# Patient Record
Sex: Female | Born: 1970 | Hispanic: Yes | Marital: Married | Smoking: Never smoker
Health system: Southern US, Community
[De-identification: ages and names within clinical notes are randomized; demographics above are authoritative.]

## PROBLEM LIST (undated history)

## (undated) DIAGNOSIS — Z9049 Acquired absence of other specified parts of digestive tract: Secondary | ICD-10-CM

## (undated) DIAGNOSIS — K449 Diaphragmatic hernia without obstruction or gangrene: Secondary | ICD-10-CM

## (undated) HISTORY — PX: BREAST SURGERY: SHX581

## (undated) HISTORY — PX: CHOLECYSTECTOMY: SHX55

## (undated) HISTORY — DX: Acquired absence of other specified parts of digestive tract: Z90.49

## (undated) HISTORY — DX: Diaphragmatic hernia without obstruction or gangrene: K44.9

## (undated) HISTORY — PX: HIATAL HERNIA REPAIR: SHX195

---

## 2021-11-06 ENCOUNTER — Other Ambulatory Visit: Payer: Self-pay

## 2021-11-06 DIAGNOSIS — Z1211 Encounter for screening for malignant neoplasm of colon: Secondary | ICD-10-CM

## 2021-11-07 ENCOUNTER — Ambulatory Visit: Payer: Self-pay | Attending: Oncology | Admitting: *Deleted

## 2021-11-07 ENCOUNTER — Ambulatory Visit: Payer: Self-pay

## 2021-11-07 ENCOUNTER — Other Ambulatory Visit: Payer: Self-pay

## 2021-11-07 VITALS — BP 124/68 | HR 74 | Resp 18 | Wt 158.0 lb

## 2021-11-07 DIAGNOSIS — Z1239 Encounter for other screening for malignant neoplasm of breast: Secondary | ICD-10-CM

## 2021-11-07 DIAGNOSIS — N644 Mastodynia: Secondary | ICD-10-CM

## 2021-11-07 DIAGNOSIS — N6321 Unspecified lump in the left breast, upper outer quadrant: Secondary | ICD-10-CM

## 2021-11-07 NOTE — Progress Notes (Signed)
[name] is a 51 y.o. female who presents to [facility] clinic today with complaint of left breast pain x one year that comes and goes. Patient rates the pain at a 6-7 out of 10. Patient referred to [facility] due to having a screening mammogram completed 09/20/2021 that additional imaging of the left breast recommended for follow up. Left breast diagnostic mammogram completed 10/13/2021 that a left breast biopsy is recommended for follow up.   Patient will go to [facility] following [facility] appointment to sign release to have images sent to [facility].   Pap Smear: Pap smear not completed today. Last Pap smear was 08/10/2021 at [facility] clinic and was normal with negative HPV. Per patient has no history of an abnormal Pap smear. Last Pap smear result is not available in Epic. Last Pap smear result will be scanned into Epic.   Physical exam: Breasts Breasts symmetrical. No skin abnormalities bilateral breasts. No nipple right breast. Left nipple inverted that per patient is normal for her. No nipple discharge bilateral breasts. No lymphadenopathy. No lumps palpated right breast. Palpated a pea sized lump within the left breast at 2 o'clock 6 cm from the nipple consistent with findings on mammogram. Complaints of left breast pain at 2 o'clock on exam.  Pelvic/Bimanual Pap is not indicated today per [facility] guidelines.   Smoking History: Patient has never smoked.   Patient Navigation: Patient education provided. Access to services provided for patient through [facility] program. Spanish interpreter Rito Ehrlich from [facility] provided.   Colorectal Cancer Screening: Per patient has never had colonoscopy completed. FIT Test given to patient to complete. No complaints today.    Breast and Cervical Cancer Risk Assessment: Patient does not have family history of breast cancer, known genetic mutations, or radiation treatment to the chest before age 1.  Patient does not have history of cervical dysplasia, immunocompromised, or DES exposure in-utero.  Risk Assessment     Risk Scores       11/07/2021   Last edited by: Lesle Chris, RN   5-year risk: 0.8 %   Lifetime risk: 6.8 %            A: [facility] exam without pap smear Complaints of left breast pain.  P: Referred patient to the [facility] for a left breast biopsy per recommendation. Delford Field will call patient to schedule appointment after they receive the outside mammogram images.  Priscille Heidelberg, RN 11/07/2021 1:30 PM

## 2021-11-07 NOTE — Patient Instructions (Signed)
Explained breast self awareness with Marin Olp.  Patient did not need a Pap smear today due to last Pap smear and HPV typing was 08/10/2021. Let her know [facility] will cover Pap smears and HPV typing every 5 years unless has a history of abnormal Pap smears. Referred patient to the [facility] for a left breast biopsy per recommendation. Delford Field will call patient to schedule appointment after they receive the outside mammogram images. Marin Olp verbalized understanding.  Elmarie Devlin, Kathaleen Maser, RN 1:30 PM

## 2021-11-15 ENCOUNTER — Ambulatory Visit: Payer: Self-pay

## 2021-11-15 ENCOUNTER — Telehealth: Payer: Self-pay

## 2021-11-15 NOTE — Telephone Encounter (Signed)
Via Julie Sowell, Spanish Interpreter ([place]), Patient informed negative FIT Test results. Patient verbalized understanding.  °

## 2021-11-16 ENCOUNTER — Inpatient Hospital Stay
Admission: RE | Admit: 2021-11-16 | Discharge: 2021-11-16 | Disposition: A | Discharge status: Home / Self Care | Payer: Self-pay | Source: Ambulatory Visit | Attending: *Deleted | Admitting: *Deleted

## 2021-11-16 ENCOUNTER — Other Ambulatory Visit: Payer: Self-pay | Admitting: *Deleted

## 2021-11-16 DIAGNOSIS — Z1231 Encounter for screening mammogram for malignant neoplasm of breast: Secondary | ICD-10-CM

## 2021-11-17 ENCOUNTER — Other Ambulatory Visit: Payer: Self-pay | Admitting: Obstetrics and Gynecology

## 2021-11-17 DIAGNOSIS — R928 Other abnormal and inconclusive findings on diagnostic imaging of breast: Secondary | ICD-10-CM

## 2021-11-29 ENCOUNTER — Other Ambulatory Visit: Payer: Self-pay | Admitting: Obstetrics and Gynecology

## 2021-11-29 DIAGNOSIS — R921 Mammographic calcification found on diagnostic imaging of breast: Secondary | ICD-10-CM

## 2021-11-29 DIAGNOSIS — R928 Other abnormal and inconclusive findings on diagnostic imaging of breast: Secondary | ICD-10-CM

## 2021-11-30 ENCOUNTER — Other Ambulatory Visit: Payer: Self-pay | Admitting: Obstetrics and Gynecology

## 2021-11-30 ENCOUNTER — Ambulatory Visit
Admission: RE | Admit: 2021-11-30 | Discharge: 2021-11-30 | Disposition: A | Discharge status: Home / Self Care | Payer: Self-pay | Source: Ambulatory Visit | Attending: Obstetrics and Gynecology | Admitting: Obstetrics and Gynecology

## 2021-11-30 ENCOUNTER — Other Ambulatory Visit: Payer: Self-pay

## 2021-11-30 ENCOUNTER — Inpatient Hospital Stay: Admission: RE | Admit: 2021-11-30 | Payer: Self-pay | Source: Ambulatory Visit

## 2021-11-30 DIAGNOSIS — R921 Mammographic calcification found on diagnostic imaging of breast: Secondary | ICD-10-CM | POA: Insufficient documentation

## 2021-11-30 DIAGNOSIS — R928 Other abnormal and inconclusive findings on diagnostic imaging of breast: Secondary | ICD-10-CM | POA: Insufficient documentation

## 2021-12-01 LAB — SURGICAL PATHOLOGY

## 2021-12-05 NOTE — Progress Notes (Signed)
Received benign biopsy result, and Radiology recommendation for surgical consult. Scheduled with Dr. Aleen Campi 12/27/21 at 10:15.  Phoned patient with appointment information, and mailed reminder/directions.  Instructed to call Joellyn Quails [facility] patient coordinator with questions . ?

## 2021-12-27 ENCOUNTER — Encounter: Payer: Self-pay | Admitting: Surgery

## 2021-12-27 ENCOUNTER — Ambulatory Visit (INDEPENDENT_AMBULATORY_CARE_PROVIDER_SITE_OTHER): Payer: Self-pay | Admitting: Surgery

## 2021-12-27 VITALS — BP 131/82 | HR 73 | Temp 97.7°F | Wt 157.0 lb

## 2021-12-27 DIAGNOSIS — N6082 Other benign mammary dysplasias of left breast: Secondary | ICD-10-CM

## 2021-12-27 NOTE — Patient Instructions (Signed)
If you have any concerns or questions, please feel free to call our office.  ? ?Autoexamen de mamas ?Breast Self-Awareness ?El autoexamen de mamas es para Geologist, engineering apariencia y la sensibilidad de las [place]. Es importante autoexaminarse las [place]. Permite detectar un problema en las mamas a tiempo mientras todav?a es peque?o y puede tratarse. Todas las mujeres deben autoexaminarse las [place], incluso aquellas que se sometieron a implantes mamarios. Informe al m?dico si advierte un cambio en las mamas. ?Lo que necesita: ?Un espejo. ?Una habitaci?n bien iluminada. ?C?mo realizar el autoexamen de [place] ?El autoexamen de [place] es una forma de aprender qu? es normal para sus mamas y revisar si hay cambios. Para hacer un autoexamen de las mamas: ?Busque cambios ? ?Qu?tese toda la ropa por encima de la cintura. ?P?rese frente a un espejo en una habitaci?n con buena iluminaci?n. ?Apoye las [employer] caderas. ?Empuje hacia abajo con las manos. ?M?rese las mamas y los pezones en el espejo para ver si una mama o un pez?n se ve diferente del otro. Durante el examen, intente determinar si: ?La forma de una mama es diferente. ?El tama?o de [place] mama es diferente. ?Hay arrugas, depresiones y protuberancias en [place] [employer] y no en la otra. ?Observe cada mama para buscar cambios en la piel, por ejemplo: ?Enrojecimiento. ?Zonas escamosas. ?Observe si hay cambios en los pezones, por ejemplo: ?L?quido alrededor [employer]. ?Sangrado. ?Hoyuelos. ?Enrojecimiento. ?Un cambio en el lugar de los pezones. ?[place] si hay cambios ? ?Acu?stese en el piso boca arriba. ?P?lpese cada mama. Para hacerlo, siga estos pasos: ?Elija una mama para palpar. ?Coloque el brazo m?s cercano a esa mama por encima de la cabeza. ?Use el otro brazo para palpar la zona del pez?n de la mama. P?lpese la zona con las yemas de los tres dedos del medio y haga c?rculos con los dedos. Con el primer c?rculo, presione suavemente. Con el segundo, m?s fuerte. Con el  tercero, a?n m?s fuerte. ?Siga haciendo c?rculos con los dedos con las diferentes presiones a medida que desciende por la mama. Det?ngase cuando sienta las costillas. ?Desplace los dedos un poco hacia el centro del cuerpo. ?Empiece a hacer c?rculos con los dedos nuevamente y esta vez haga movimientos ascendentes hasta llegar a la clav?cula. ?Siga haciendo c?rculos [place] y Milinda Antis llegar a la axila. Recuerde hacerlos con las tres presiones. ?P?lpese la otra mama de la misma forma. ?Si?ntese o p?rese en la ducha o la ba?era. ?Con agua jabonosa en la piel, p?lpese cada mama del mismo modo que lo hizo en el paso 2 mientras estaba acostada en el piso. ?Anote sus hallazgos ?Anotar lo que encuentra puede ayudarla a recordar qu? contarle al m?dico. Escriba los siguientes datos: ?Qu? es normal para cada mama. ?Cualquier cambio que encuentre en cada mama, por ejemplo: ?La clase de cambios que encuentra. ?Si tiene dolor. ?Si hay bultos, su tama?o y ubicaci?n. ?Cu?ndo tuvo su ?ltimo per?odo menstrual. ?Consejos generales ?Exam?nese las [employer]. ?Si est? amamantando, el mejor momento para el examen de las mamas es despu?s de darle de Psychologist, clinical al beb? o despu?s de Manufacturing engineer. ?Si tiene per?odos menstruales, el mejor momento para hacerlo es de 5 a 7 d?as despu?s de la finalizado el per?odo menstrual. ?Con el tiempo, se sentir? c?moda con el autoexamen y comenzar? a saber si hay cambios en sus mamas. ?Comun?quese con un m?dico si: ?Observa un cambio en la forma o el tama?o de las [place] o  los pezones. ?Observa un cambio en la piel de las mamas o los pezones, como la piel enrojecida o escamosa. ?Tiene secreci?n de l?quido proveniente de los pezones que no es normal. ?Encuentra un n?dulo o una zona engrosada que no ten?a antes. ?Tiene dolor en las mamas. ?Tiene alguna inquietud sobre la salud de la mama. ?Resumen ?El autoexamen de mamas incluye buscar cambios en las mamas, y tambi?n palpar para  detectar cualquier cambio en las mamas. ?El autoexamen de mamas debe hacerse frente a un espejo en una habitaci?n bien iluminada. ?Debe revisarse las mamas todos los meses. Si tiene per?odos menstruales, el mejor momento para hacerlo es de 5 a 7 d?as despu?s de la finalizado el per?odo menstrual. ?Informe al m?dico si ve cambios en las mamas, como cambios en el tama?o, cambios en la piel, dolor o sensibilidad, o un l?quido inusual que sale de los pezones. ?Esta informaci?n no tiene como fin reemplazar el consejo del m?dico. Aseg?rese de hacerle al m?dico cualquier pregunta que tenga. ?Document Revised: 06/10/2018 Document Reviewed: 06/10/2018 ?Elsevier Patient Education ? 2022 Elsevier Inc. ? ?

## 2021-12-27 NOTE — Progress Notes (Signed)
?12/27/2021 ? ?Reason for Visit:  Left breast flat epithelial atypia ? ?Requesting Provider:  Coralee Rud, RN ? ?History of Present Illness: ?Tracy Dawson is a 51 y.o. female presenting for evaluation of flat epithelial atypia.  The patient just recently moved from [place], [place].  Prior to her move, she had a screening mammogram which showed some calcifications of the left breast.  This was confirmed on diagnostic mammogram.  It was noted that she had an extensive area of calcification in the left outer lower quadrant, and two biopsies were recommended, one anterior and one posterior.  After her move, she was set up with our breast center and had the left breast biopsies done on 11/30/21.  Pathology results showed columnar cell lesion with associated calcifications and focal flat epithelial atypia.  Per radiology report, their recommendation is to excise the area of calcifications extending between both biopsy sites.   ? ?The patient reports that she was surprised about the initial mammography results and needing further imaging and then biopsies.  She reports having bilateral breast pain issues that she's had chronically, with sometimes the right breat being worse than the left breast.  She also has left nipple inversion and to a milder degree right nipple inversion, but this is also chronic for her.  She is post-menopausal. ? ?Past Medical History: ?Past Medical History:  ?Diagnosis Date  ? Hiatal hernia   ? History of cholecystectomy   ?  ? ?Past Surgical History: ?Past Surgical History:  ?Procedure Laterality Date  ? CHOLECYSTECTOMY    ? HIATAL HERNIA REPAIR    ? ? ?Home Medications: ?Prior to Admission medications   ?Not on File  ? ? ?Allergies: ?No Known Allergies ? ?Social History: ? reports that she has never smoked. She has never used smokeless tobacco. She reports that she does not drink alcohol and does not use drugs.  ? ?Family History: ?History reviewed. No pertinent family history. ? ?Review of  Systems: ?Review of Systems  ?Constitutional:  Negative for chills and fever.  ?HENT:  Negative for hearing loss.   ?Respiratory:  Negative for shortness of breath.   ?Cardiovascular:  Negative for chest pain.  ?Gastrointestinal:  Negative for abdominal pain, nausea and vomiting.  ?Genitourinary:  Negative for dysuria.  ?Musculoskeletal:  Negative for myalgias.  ?Skin:  Negative for rash.  ?     Bilateral breast pain  ?Neurological:  Negative for dizziness.  ?Psychiatric/Behavioral:  Negative for depression.   ? ?Physical Exam ?BP 131/82   Pulse 73   Temp 97.7 ?F (36.5 ?C) (Oral)   Wt 157 lb (71.2 kg)   SpO2 99%  ?CONSTITUTIONAL: No acute distress, well nourished. ?HEENT:  Normocephalic, atraumatic, extraocular motion intact. ?NECK: Trachea is midline, and there is no jugular venous distension.  ?RESPIRATORY:  Lungs are clear, and breath sounds are equal bilaterally. Normal respiratory effort without pathologic use of accessory muscles. ?CARDIOVASCULAR: Heart is regular without murmurs, gallops, or rubs. ?BREAST:  Right breast without any palpable masses, skin changes, or nipple changes other than her known nipple inversion.  She does have fibrocystic changes throughout which are tender in some areas.  No right axillary lymphadenopathy.  Left breast s/p lateral biopsies, with mild ecchymosis, but otherwise no palpable masses, skin changes, or nipple changes other than her known nipple inversion.  She also has fibrocystic changes similar to right breast, with some tenderness in different areas.  No left axillary lymphadenopathy. ?MUSCULOSKELETAL:  Normal muscle strength and tone in all four extremities.  No peripheral edema or cyanosis. ?SKIN: Skin turgor is normal. There are no pathologic skin lesions.  ?NEUROLOGIC:  Motor and sensation is grossly normal.  Cranial nerves are grossly intact. ?PSYCH:  Alert and oriented to person, place and time. Affect is normal. ? ?Laboratory Analysis: ?Pathology results  11/30/21: ?DIAGNOSIS:  ?A.  BREAST CALCIFICATIONS, LEFT LATERAL LOWER POSTERIOR; STEREOTACTIC  ?BIOPSY (X CLIP):  ?- COLUMNAR CELL LESION WITH ASSOCIATED CALCIFICATIONS AND FOCAL FLAT  ?EPITHELIAL ATYPIA.  ?- SEE COMMENT.  ? ?B.  BREAST CALCIFICATIONS, LEFT LATERAL LOWER ANTERIOR; STEREOTACTIC  ?BIOPSY (COIL CLIP):  ?- COLUMNAR CELL LESION WITH ASSOCIATED CALCIFICATIONS AND FOCAL FLAT  ?EPITHELIAL ATYPIA.  ?- SEE COMMENT.  ? ? ?Assessment and Plan: ?This is a 51 y.o. female with left breast flat epithelial atypia. ? ?--Discussed with the patient the findings on her mammograms and the biopsy results.  Unfortunately I do not have any outside reports from the initial mammograms, and it is unclear to me how large of an area this is.  When I measure the areas between the biopsy clips, this is about 4.5 x 2 cm when measuring the CC and ML views which does not include potential margins needed.  Overall this is a significant size when comparing to her breast size overall.  From the pathology results, and on search of flat epithelial atypia, there are different studies showing different results of whether is does or does not pose an increased risk of breast cancer.   ?--At this point, I discussed with the patient that I need to have a conversation with radiology to determine more accurate dimension of the area they recommend excising and if there is any role for close follow up of the calcifications instead of excision.  Based on the size and recommendations, I will call the patient back to discuss those and whether we should proceed with excision or close follow up.  If excision is recommended and the area involved is relatively large, she may benefit from referral to a breast surgeon that may be able to perform better cosmetic results or for a second opinion. ?--Patient is in agreement with this plan and all of her questions have been answered.  Follow up depends on conversation with radiology and decision from patient  afterwards. ? ?I spent 60 minutes dedicated to the care of this patient on the date of this encounter to include pre-visit review of records, face-to-face time with the patient discussing diagnosis and management, and any post-visit coordination of care. ? ? ?Howie Ill, MD ?[facility] ? ?  ?

## 2022-01-04 ENCOUNTER — Telehealth: Payer: Self-pay | Admitting: Surgery

## 2022-01-04 NOTE — Telephone Encounter (Signed)
01/04/22 ? ?Spoke with patient today about her mammogram images and diagnosis of FEA.  She was seen on 12/27/21 for this and the recommendation was that this area of FEA should be excised.  However, on mammograms, this area seemed to be large based on clip location from her two biopsies, and may be too large for her breast size to do a lumpectomy alone.  I contacted the radiology team at [facility] to confirm that size of the area, and this appears to be about 4.8 x 4 cm based on imaging.  Given the size, I think it would be better to refer the patient to a tertiary center to discuss with her about potential surgical options given the size of this area, and for possibly a better cosmetic result that what I would be able to offer.  She is in agreement and prefers a referral to [facility].  Patient may follow up with me as needed and she can contact us if any questions or concerns. ? ?Henrene Dodge, MD ? ?

## 2022-01-12 ENCOUNTER — Telehealth: Payer: Self-pay

## 2022-01-12 NOTE — Telephone Encounter (Signed)
Spoke with the patient via interpreter services and gave the patient the number for [facility]. They had attempted to contact the patient but her number was not working at that time. She will call them to schedule an appointment.  ?

## 2022-12-25 ENCOUNTER — Telehealth: Payer: Self-pay | Admitting: *Deleted

## 2022-12-25 ENCOUNTER — Other Ambulatory Visit: Payer: Self-pay

## 2022-12-25 DIAGNOSIS — N6320 Unspecified lump in the left breast, unspecified quadrant: Secondary | ICD-10-CM

## 2022-12-25 DIAGNOSIS — R921 Mammographic calcification found on diagnostic imaging of breast: Secondary | ICD-10-CM

## 2023-09-27 IMAGING — MG MM BREAST BX W LOC DEV 1ST LESION IMAGE BX SPEC STEREO GUIDE*L*
7 of 11 series · 7 of 31 positions shown · non-contrast
Comparison: Previous exams.
COMPARISON: Previous exams.

Addendum:
CLINICAL DATA: Left breast calcifications for biopsy

EXAM:
LEFT BREAST STEREOTACTIC CORE NEEDLE BIOPSY

[L (1 of 3)]
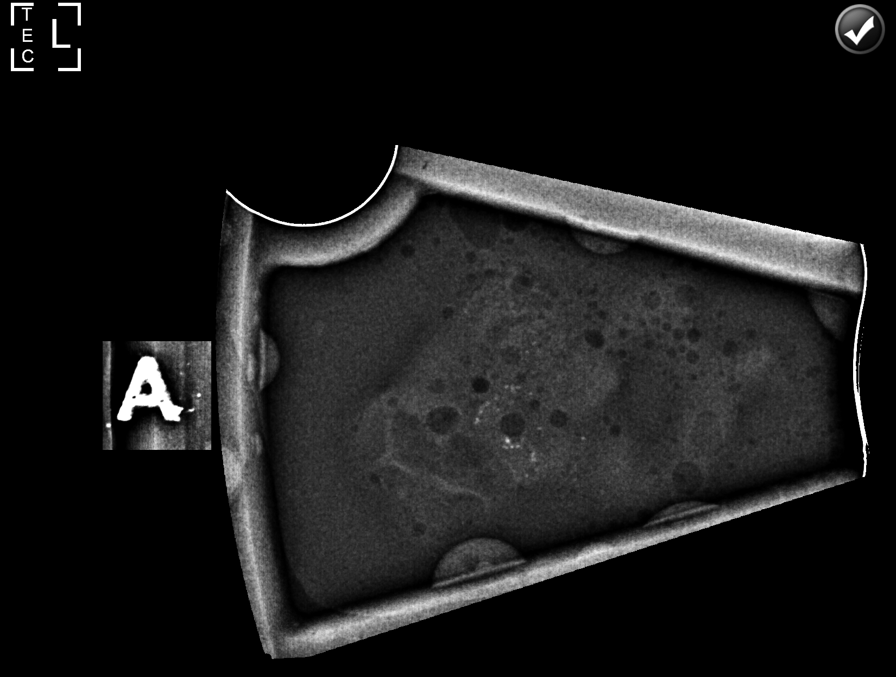

[L (2 of 3)]
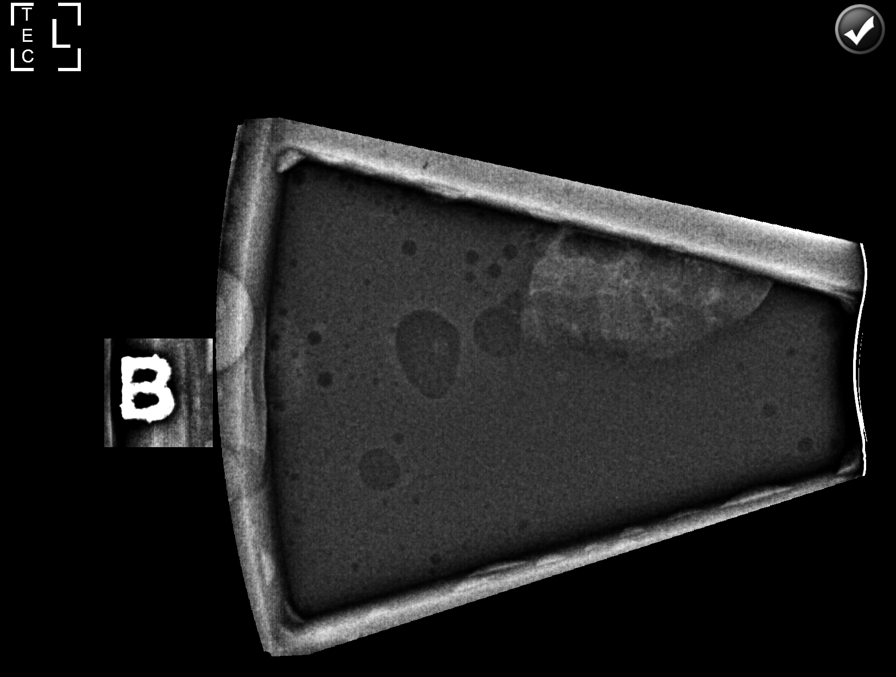

[L (3 of 3)]
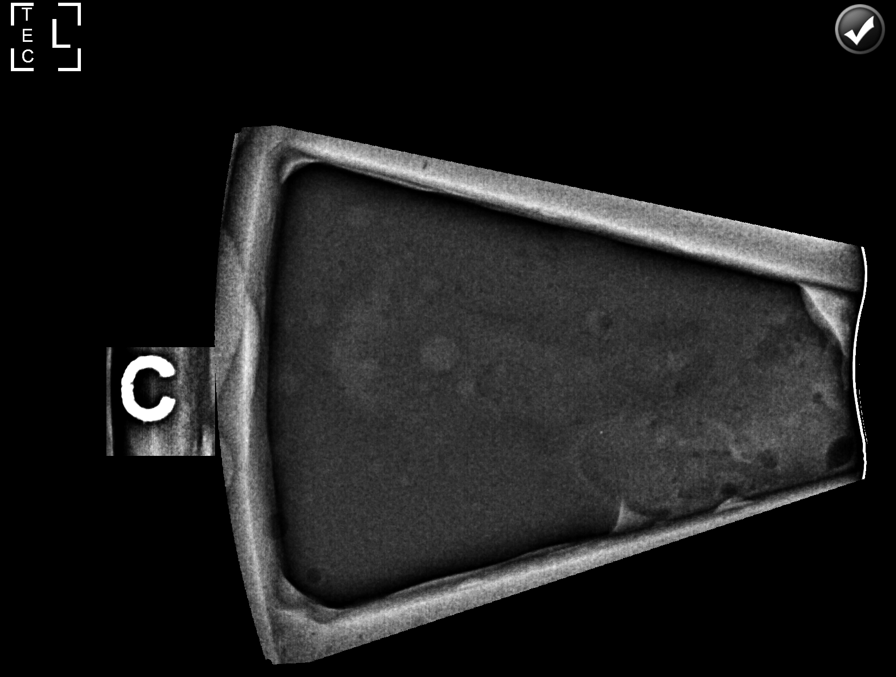

[L FB]
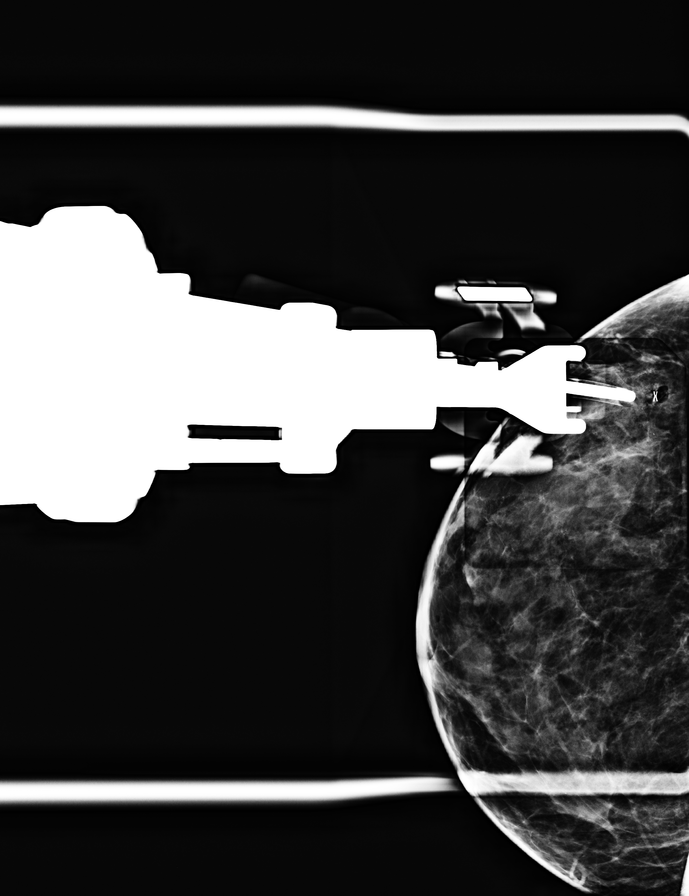

[L ML synth-2D]
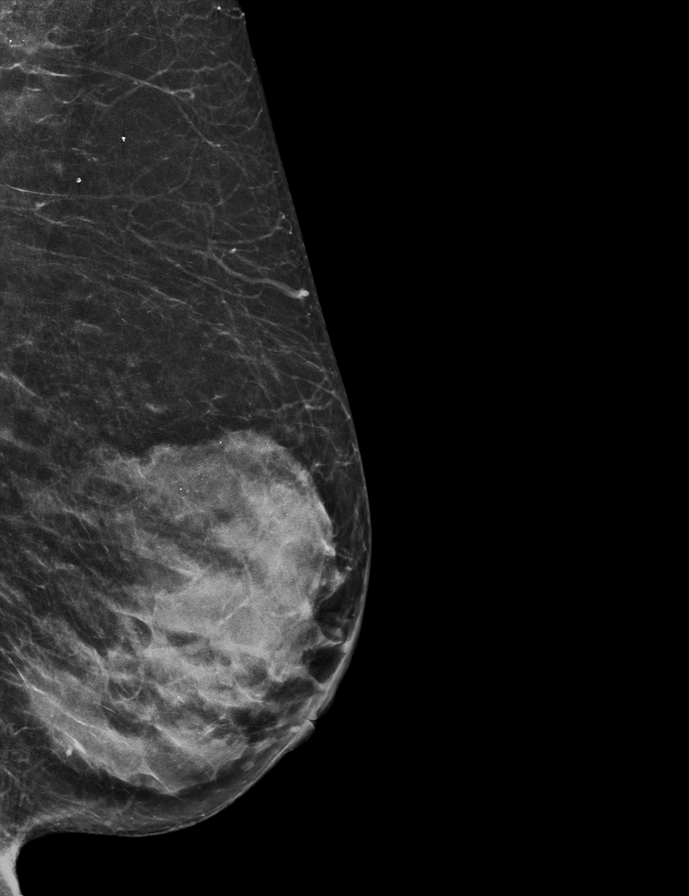

[L CC synth-2D]
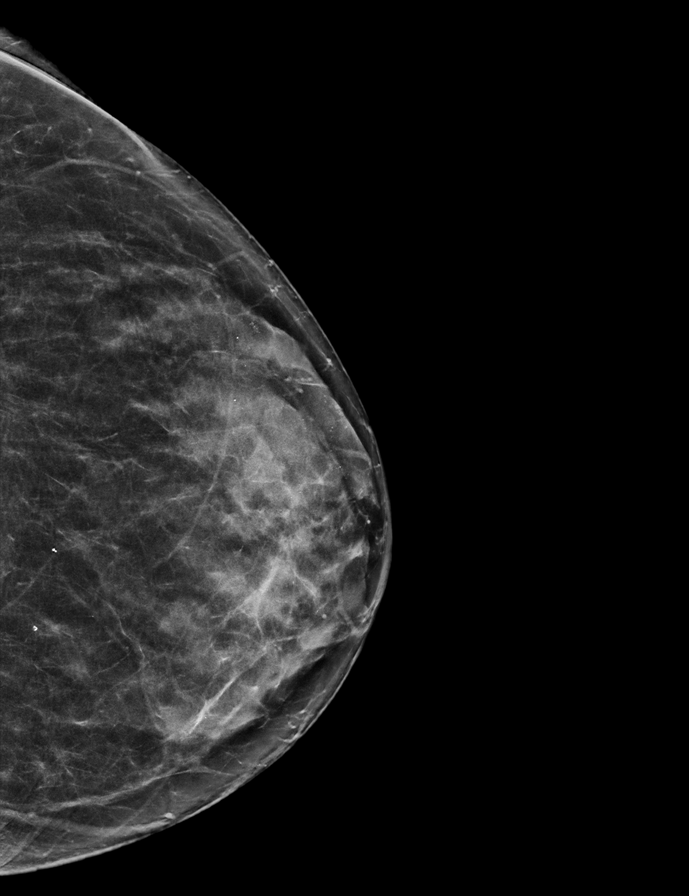

[L FB tomo · tomo slice 25/49.0]
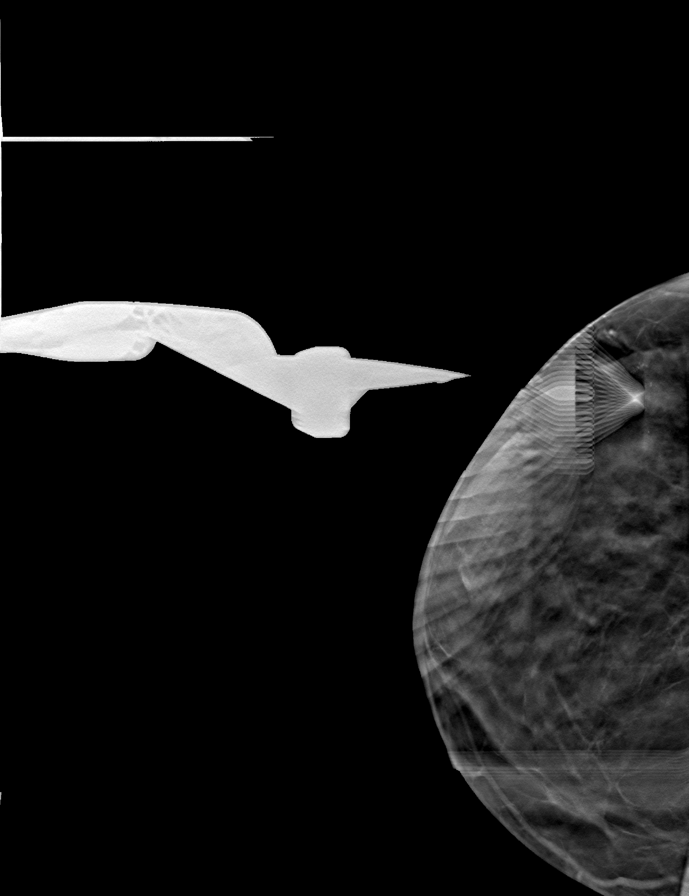

[7 of 31 positions shown; findings below may reference images not displayed]



A: Using sterile technique and 1% Lidocaine as local anesthetic,
under stereotactic guidance, a 9 gauge vacuum assisted device was
used to perform core needle biopsy of calcifications in the lateral
posterior lower left breast using a caudal approach. Specimen
radiograph was performed showing inclusion of calcifications of
concern. Specimens with calcifications are identified for pathology.

Lesion quadrant: Posterolateral lower quadrant

At the conclusion of the procedure, X shaped tissue marker clip was
deployed into the biopsy cavity. Follow-up 2-view mammogram was
performed and dictated separately.

B: Using sterile technique and 1% Lidocaine as local anesthetic,
under stereotactic guidance, a 9 gauge vacuum assisted device was
used to perform core needle biopsy of calcifications in the anterior
lower outer quadrant of the left breast using a caudal approach.
Specimen radiograph was performed showing inclusion of
calcifications concern. Specimens with calcifications are identified
for pathology.

Lesion quadrant: Anterolateral lower quadrant

At the conclusion of the procedure, coil shaped tissue marker clip
was deployed into the biopsy cavity. Follow-up 2-view mammogram was
performed and dictated separately.
IMPRESSION: Stereotactic-guided biopsy of left breast. No apparent
complications.

ADDENDUM:
PATHOLOGY revealed: Site A. BREAST CALCIFICATIONS, LEFT LATERAL
LOWER POSTERIOR; STEREOTACTIC BIOPSY (X CLIP): - COLUMNAR CELL
LESION WITH ASSOCIATED CALCIFICATIONS AND FOCAL FLAT EPITHELIAL
ATYPIA. - SEE COMMENT.

Pathology results are CONCORDANT with imaging findings, per Dr.
Alademehin Sidney with excision recommended.

PATHOLOGY revealed: Site B. BREAST CALCIFICATIONS, LEFT LATERAL
LOWER ANTERIOR; STEREOTACTIC BIOPSY (COIL CLIP): - COLUMNAR CELL
LESION WITH ASSOCIATED CALCIFICATIONS AND FOCAL FLAT EPITHELIAL
ATYPIA. - Comment: Sections demonstrate areas of dilated cystic
spaces lined by columnar cell change with focal low-grade cytologic
atypia. Invasive carcinoma, ductal carcinoma in situ (DCIS), and
atypical ductal hyperplasia (ADH) are not identified in this sampled
material. Columnar cell lesions (CCL) are most often detected by the
presence of grouped calcifications on mammography. The risk of
progression for flat epithelial atypia (BASIRA) appears to be very low
and is not associated with the same level of breast cancer risk
associated with atypical lobular hyperplasia (ALH) and atypical
ductal hyperplasia (ADH). As such, BASIRA should not be managed the
same as ALH and ADH. Correlation with post-biopsy mammogram is
advised to determine if the radiographic microcalcifications have
been removed and to guide further management.

Pathology results are CONCORDANT with imaging findings, per Dr.
Koffi Brenes with excision recommended.

Pathology results and recommendations below were discussed with
patient via [employer]# [id number] by telephone on 12/01/2021.
Patient reported biopsy site within normal limits with slight
tenderness at the site. Post biopsy care instructions were reviewed,
questions were answered and my direct phone number was provided to
patient. Patient was instructed to call [HOSPITAL] if
any concerns or questions arise related to the biopsy.

RECOMMENDATIONS: Surgical consultation for both sites A and B.
Recommend all calcifications between site A and B the excised.
Request for surgical consultation relayed to Na Dine Pohland RN and
Quo Ara RN at [HOSPITAL] [HOSPITAL] by Genny Viramontes
RN on 12/01/2021.

Pathology results reported by Genny Viramontes RN on 12/01/2021.



A: Using sterile technique and 1% Lidocaine as local anesthetic,
under stereotactic guidance, a 9 gauge vacuum assisted device was
used to perform core needle biopsy of calcifications in the lateral
posterior lower left breast using a caudal approach. Specimen
radiograph was performed showing inclusion of calcifications of
concern. Specimens with calcifications are identified for pathology.

Lesion quadrant: Posterolateral lower quadrant

At the conclusion of the procedure, X shaped tissue marker clip was
deployed into the biopsy cavity. Follow-up 2-view mammogram was
performed and dictated separately.

B: Using sterile technique and 1% Lidocaine as local anesthetic,
under stereotactic guidance, a 9 gauge vacuum assisted device was
used to perform core needle biopsy of calcifications in the anterior
lower outer quadrant of the left breast using a caudal approach.
Specimen radiograph was performed showing inclusion of
calcifications concern. Specimens with calcifications are identified
for pathology.

Lesion quadrant: Anterolateral lower quadrant

At the conclusion of the procedure, coil shaped tissue marker clip
was deployed into the biopsy cavity. Follow-up 2-view mammogram was
performed and dictated separately.
IMPRESSION: Stereotactic-guided biopsy of left breast. No apparent
complications.

## 2023-09-27 IMAGING — MG MM BREAST BX W LOC DEV EA AD LESION IMG BX SPEC STEREO GUIDE*L*
7 series · 7 of 19 positions shown · non-contrast
Comparison: Previous exams.
COMPARISON: Previous exams.

Addendum:
CLINICAL DATA: Left breast calcifications for biopsy

EXAM:
LEFT BREAST STEREOTACTIC CORE NEEDLE BIOPSY

[L (1 of 3)]
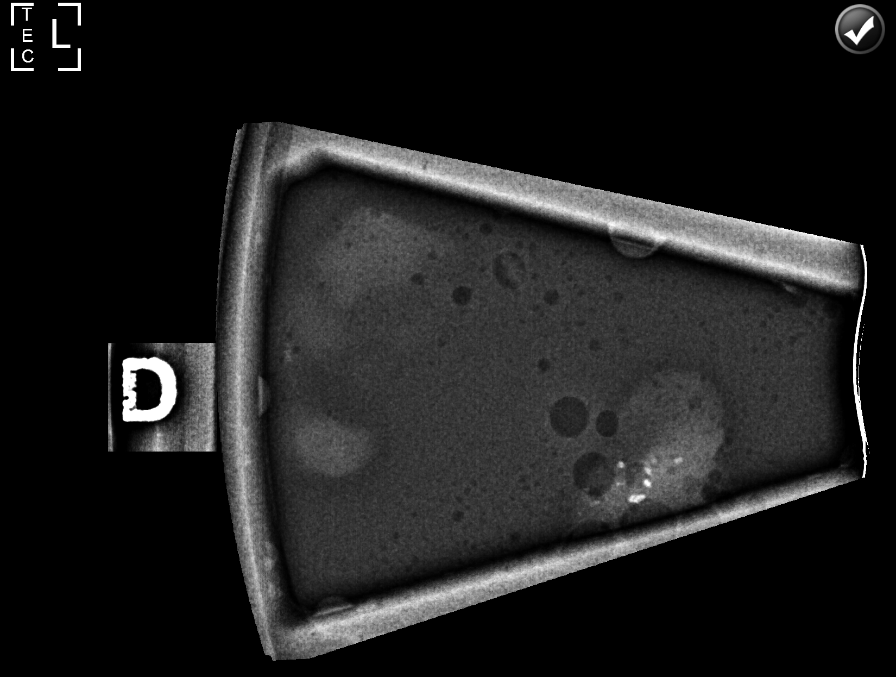

[L (2 of 3)]
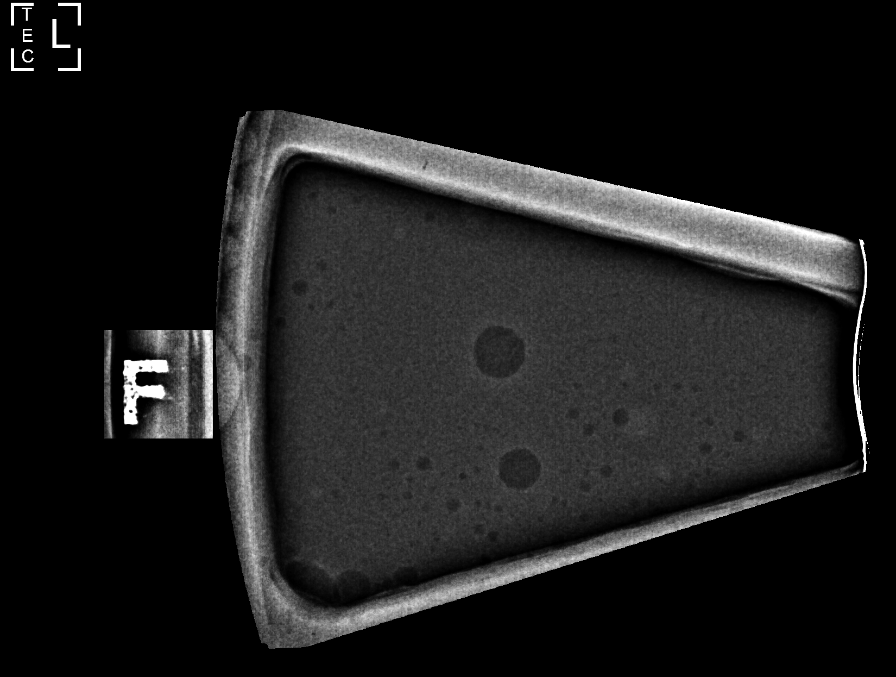

[L (3 of 3)]
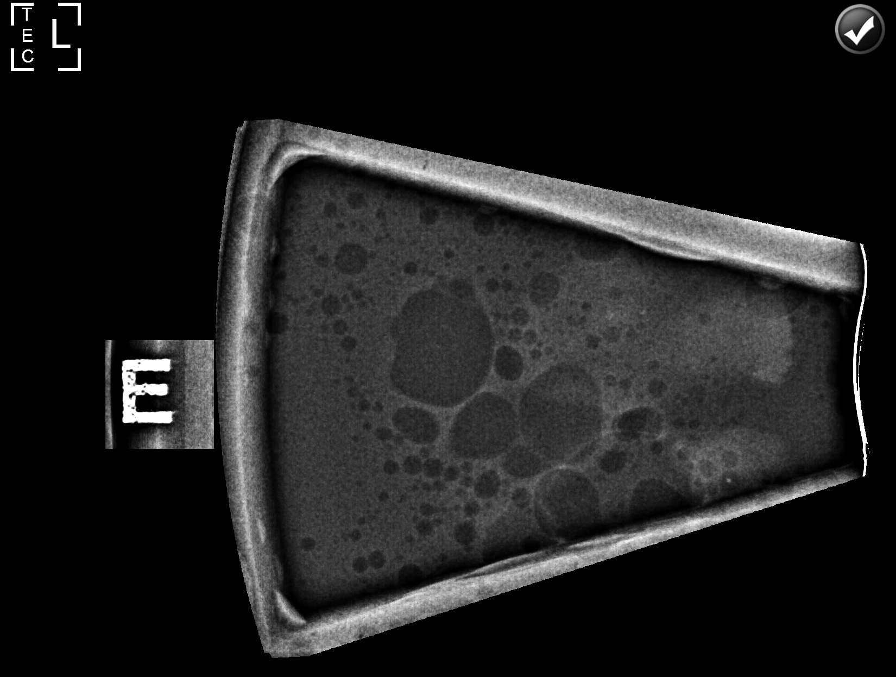

[L FB]
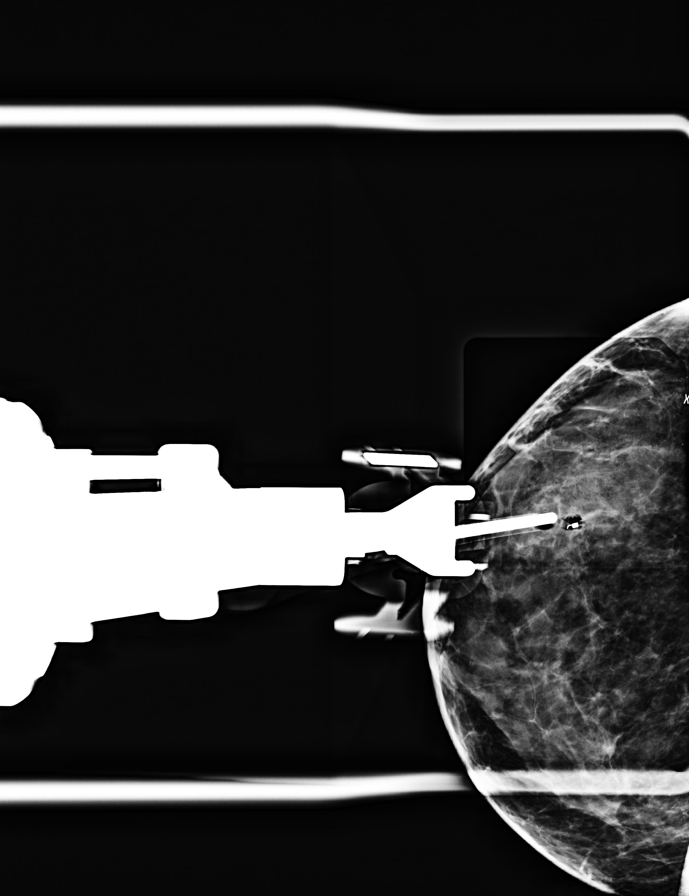

[L FB tomo (1 of 3) · tomo slice 25/49.0]
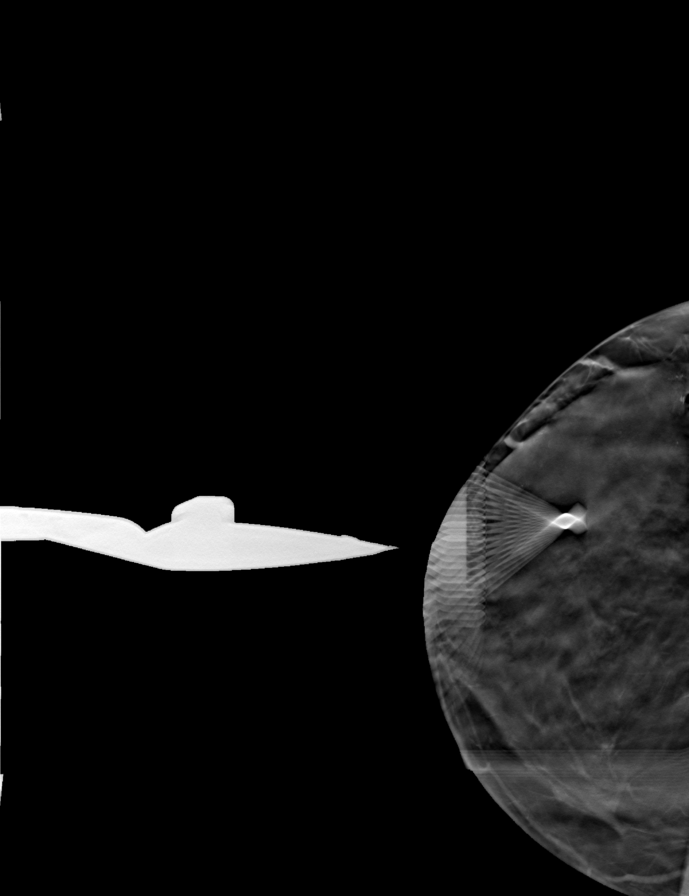

[L FB tomo (2 of 3) · tomo slice 25/49.0]
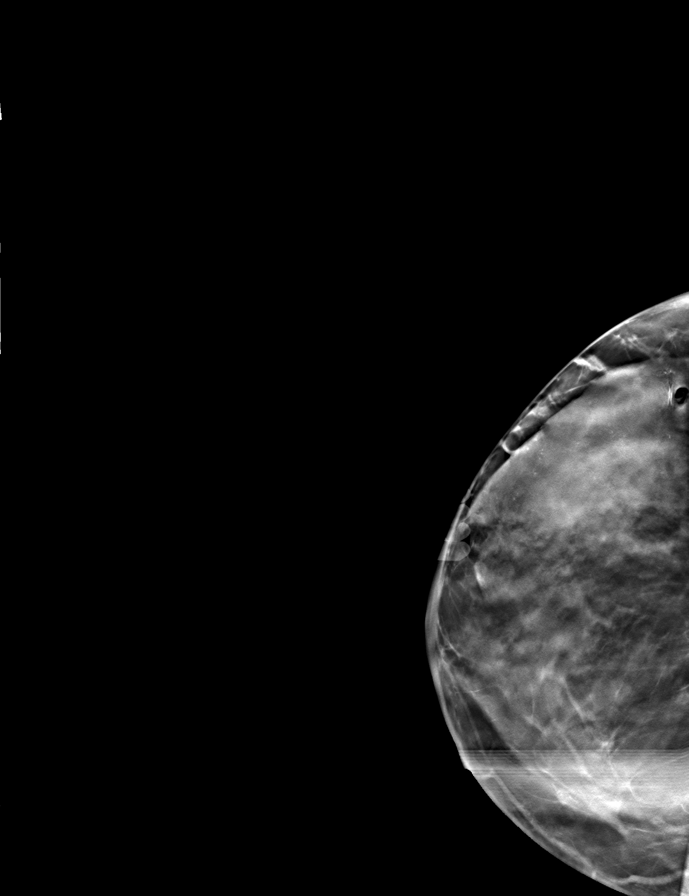

[L FB tomo (3 of 3) · tomo slice 25/49.0]
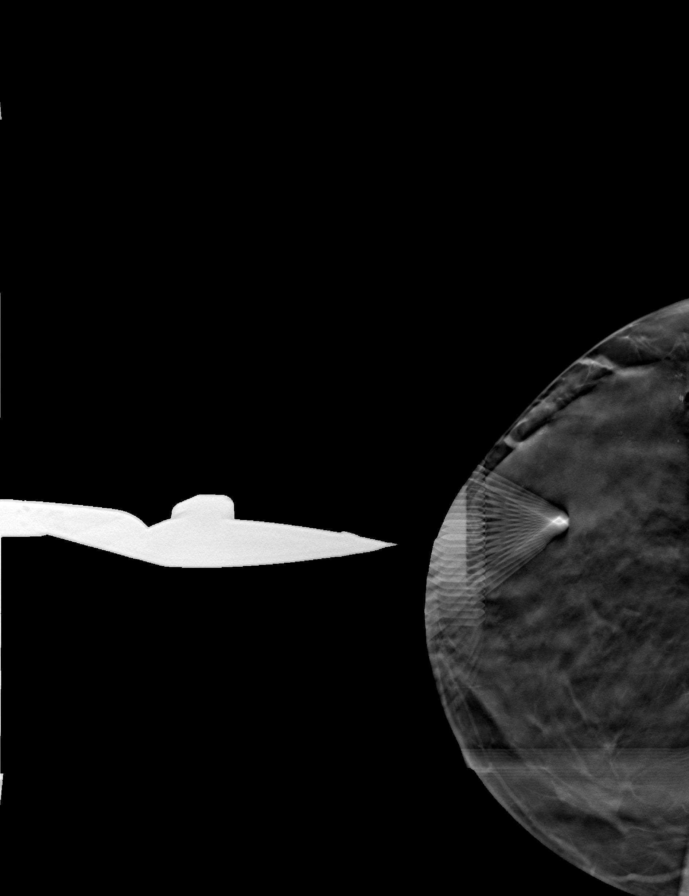

[7 of 19 positions shown; findings below may reference images not displayed]



A: Using sterile technique and 1% Lidocaine as local anesthetic,
under stereotactic guidance, a 9 gauge vacuum assisted device was
used to perform core needle biopsy of calcifications in the lateral
posterior lower left breast using a caudal approach. Specimen
radiograph was performed showing inclusion of calcifications of
concern. Specimens with calcifications are identified for pathology.

Lesion quadrant: Posterolateral lower quadrant

At the conclusion of the procedure, X shaped tissue marker clip was
deployed into the biopsy cavity. Follow-up 2-view mammogram was
performed and dictated separately.

B: Using sterile technique and 1% Lidocaine as local anesthetic,
under stereotactic guidance, a 9 gauge vacuum assisted device was
used to perform core needle biopsy of calcifications in the anterior
lower outer quadrant of the left breast using a caudal approach.
Specimen radiograph was performed showing inclusion of
calcifications concern. Specimens with calcifications are identified
for pathology.

Lesion quadrant: Anterolateral lower quadrant

At the conclusion of the procedure, coil shaped tissue marker clip
was deployed into the biopsy cavity. Follow-up 2-view mammogram was
performed and dictated separately.
IMPRESSION: Stereotactic-guided biopsy of left breast. No apparent
complications.

ADDENDUM:
PATHOLOGY revealed: Site A. BREAST CALCIFICATIONS, LEFT LATERAL
LOWER POSTERIOR; STEREOTACTIC BIOPSY (X CLIP): - COLUMNAR CELL
LESION WITH ASSOCIATED CALCIFICATIONS AND FOCAL FLAT EPITHELIAL
ATYPIA. - SEE COMMENT.

Pathology results are CONCORDANT with imaging findings, per Dr.
Alademehin Sidney with excision recommended.

PATHOLOGY revealed: Site B. BREAST CALCIFICATIONS, LEFT LATERAL
LOWER ANTERIOR; STEREOTACTIC BIOPSY (COIL CLIP): - COLUMNAR CELL
LESION WITH ASSOCIATED CALCIFICATIONS AND FOCAL FLAT EPITHELIAL
ATYPIA. - Comment: Sections demonstrate areas of dilated cystic
spaces lined by columnar cell change with focal low-grade cytologic
atypia. Invasive carcinoma, ductal carcinoma in situ (DCIS), and
atypical ductal hyperplasia (ADH) are not identified in this sampled
material. Columnar cell lesions (CCL) are most often detected by the
presence of grouped calcifications on mammography. The risk of
progression for flat epithelial atypia (BASIRA) appears to be very low
and is not associated with the same level of breast cancer risk
associated with atypical lobular hyperplasia (ALH) and atypical
ductal hyperplasia (ADH). As such, BASIRA should not be managed the
same as ALH and ADH. Correlation with post-biopsy mammogram is
advised to determine if the radiographic microcalcifications have
been removed and to guide further management.

Pathology results are CONCORDANT with imaging findings, per Dr.
Koffi Brenes with excision recommended.

Pathology results and recommendations below were discussed with
patient via [employer]# [id number] by telephone on 12/01/2021.
Patient reported biopsy site within normal limits with slight
tenderness at the site. Post biopsy care instructions were reviewed,
questions were answered and my direct phone number was provided to
patient. Patient was instructed to call [HOSPITAL] if
any concerns or questions arise related to the biopsy.

RECOMMENDATIONS: Surgical consultation for both sites A and B.
Recommend all calcifications between site A and B the excised.
Request for surgical consultation relayed to Na Dine Pohland RN and
Quo Ara RN at [HOSPITAL] [HOSPITAL] by Genny Viramontes
RN on 12/01/2021.

Pathology results reported by Genny Viramontes RN on 12/01/2021.



A: Using sterile technique and 1% Lidocaine as local anesthetic,
under stereotactic guidance, a 9 gauge vacuum assisted device was
used to perform core needle biopsy of calcifications in the lateral
posterior lower left breast using a caudal approach. Specimen
radiograph was performed showing inclusion of calcifications of
concern. Specimens with calcifications are identified for pathology.

Lesion quadrant: Posterolateral lower quadrant

At the conclusion of the procedure, X shaped tissue marker clip was
deployed into the biopsy cavity. Follow-up 2-view mammogram was
performed and dictated separately.

B: Using sterile technique and 1% Lidocaine as local anesthetic,
under stereotactic guidance, a 9 gauge vacuum assisted device was
used to perform core needle biopsy of calcifications in the anterior
lower outer quadrant of the left breast using a caudal approach.
Specimen radiograph was performed showing inclusion of
calcifications concern. Specimens with calcifications are identified
for pathology.

Lesion quadrant: Anterolateral lower quadrant

At the conclusion of the procedure, coil shaped tissue marker clip
was deployed into the biopsy cavity. Follow-up 2-view mammogram was
performed and dictated separately.
IMPRESSION: Stereotactic-guided biopsy of left breast. No apparent
complications.

## 2023-10-11 ENCOUNTER — Telehealth: Payer: Self-pay

## 2023-10-11 NOTE — Telephone Encounter (Signed)
Pt called and stated that is having issues with her left breast and would like to make an appt. Do you mind reaching out to assist in scheduling ?

## 2023-10-21 ENCOUNTER — Ambulatory Visit: Payer: Self-pay

## 2023-11-05 ENCOUNTER — Other Ambulatory Visit: Payer: Self-pay

## 2023-11-05 ENCOUNTER — Encounter: Payer: Self-pay | Admitting: Gerontology

## 2023-11-05 ENCOUNTER — Ambulatory Visit: Payer: Self-pay | Admitting: Gerontology

## 2023-11-05 VITALS — BP 125/82 | HR 65 | Ht 63.5 in | Wt 169.3 lb

## 2023-11-05 DIAGNOSIS — N644 Mastodynia: Secondary | ICD-10-CM | POA: Insufficient documentation

## 2023-11-05 DIAGNOSIS — H6120 Impacted cerumen, unspecified ear: Secondary | ICD-10-CM | POA: Insufficient documentation

## 2023-11-05 DIAGNOSIS — H6121 Impacted cerumen, right ear: Secondary | ICD-10-CM

## 2023-11-05 DIAGNOSIS — Z7689 Persons encountering health services in other specified circumstances: Secondary | ICD-10-CM | POA: Insufficient documentation

## 2023-11-05 MED ORDER — FT EARWAX REMOVAL KIT 6.5 % OT SOLN
5.0000 [drp] | Freq: Two times a day (BID) | OTIC | 0 refills | Status: AC
  Filled 2023-11-05: qty 15, 30d supply, fill #0

## 2023-11-05 NOTE — Progress Notes (Signed)
New Patient Office Visit  Subjective    Patient ID: Tracy Dawson, female    DOB: 12-01-1970  Age: 53 y.o. MRN: [id number]  CC:  Chief Complaint  Patient presents with   Establish Care    Need mammo     HPI Tracy Dawson presents to establish care She had left [facility] excision[facility] breast biopsy done on 02/20/22 by Dr Dellis Anes at [facility]. Currently, she states that she needs to follow up with [facility] and requests a pcp. She states that she continues to experience  intermittent burning sensation to left breast and discomfort to bilateral breast, but denies nipple discharge. She c/o intermittent right otalgia unable to determine when it started, and denies otorrhea. She denies chest pain, palpitation, shortness of breath and vision changes. Overall, she states that she's doing well and offers no further complaint.     Past Medical History:  Diagnosis Date   Hiatal hernia    History of cholecystectomy     Past Surgical History:  Procedure Laterality Date   BREAST SURGERY     CHOLECYSTECTOMY     HIATAL HERNIA REPAIR      Family History  Problem Relation Age of Onset   High blood pressure Mother    Diabetes Father    Diabetes Brother    Kidney disease Brother     Social History   Socioeconomic History   Marital status: Married    Spouse name: Not on file   Number of children: Not on file   Years of education: Not on file   Highest education level: Not on file  Occupational History   Not on file  Tobacco Use   Smoking status: Never    Passive exposure: Never   Smokeless tobacco: Never  Vaping Use   Vaping status: Never Used  Substance and Sexual Activity   Alcohol use: Never   Drug use: Never   Sexual activity: Not Currently  Other Topics Concern   Not on file  Social History Narrative   Not on file   Social Drivers of Health   Financial Resource Strain: Medium Risk (11/05/2023)   Overall Financial Resource Strain (CARDIA)     Difficulty of Paying Living Expenses: Somewhat hard  Food Insecurity: Food Insecurity Present (11/05/2023)   Hunger Vital Sign    Worried About Running Out of Food in the Last Year: Sometimes true    Ran Out of Food in the Last Year: Sometimes true  Transportation Needs: No Transportation Needs (11/05/2023)   PRAPARE - Administrator, Civil Service (Medical): No    Lack of Transportation (Non-Medical): No  Physical Activity: Inactive (11/05/2023)   Exercise Vital Sign    Days of Exercise per Week: 0 days    Minutes of Exercise per Session: 0 min  Stress: Stress Concern Present (11/05/2023)   [employer] of Occupational Health - Occupational Stress Questionnaire    Feeling of Stress : Rather much  Social Connections: Moderately Integrated (11/05/2023)   Social Connection and Isolation Panel [NHANES]    Frequency of Communication with Friends and Family: More than three times a week    Frequency of Social Gatherings with Friends and Family: Once a week    Attends Religious Services: More than 4 times per year    Active Member of [employer] or Organizations: No    Attends Banker Meetings: Never    Marital Status: Married  Catering manager Violence: Not At Risk (11/05/2023)  Humiliation, Afraid, Rape, and Kick questionnaire    Fear of Current or Ex-Partner: No    Emotionally Abused: No    Physically Abused: No    Sexually Abused: No    Review of Systems  Constitutional: Negative.   HENT:  Positive for ear pain (right ear).   Eyes: Negative.   Respiratory: Negative.    Cardiovascular: Negative.   Gastrointestinal: Negative.   Genitourinary: Negative.   Musculoskeletal: Negative.   Skin: Negative.   Neurological: Negative.   Endo/Heme/Allergies: Negative.   Psychiatric/Behavioral: Negative.          Objective    BP 125/82 (BP Location: Left Arm, Patient Position: Sitting, Cuff Size: Small)   Pulse 65   Ht 5' 3.5" (1.613 m)   Wt 169 lb 4.8 oz (76.8  kg)   SpO2 99%   BMI 29.52 kg/m   Physical Exam HENT:     Head: Normocephalic and atraumatic.     Right Ear: Hearing normal. There is impacted cerumen.     Left Ear: Hearing normal. There is no impacted cerumen.     Mouth/Throat:     Mouth: Mucous membranes are moist.  Eyes:     Extraocular Movements: Extraocular movements intact.     Pupils: Pupils are equal, round, and reactive to light.  Cardiovascular:     Rate and Rhythm: Normal rate and regular rhythm.     Pulses: Normal pulses.     Heart sounds: Normal heart sounds.  Pulmonary:     Effort: Pulmonary effort is normal.     Breath sounds: Normal breath sounds.  Chest:  Breasts:    Right: Inverted nipple and tenderness (with palpation to right upper quadrant between 11-1o'clock) present. No swelling, nipple discharge or skin change.     Left: Inverted nipple and tenderness (with palpation to 11-1 o'clock) present. No swelling, nipple discharge or skin change.  Abdominal:     General: Bowel sounds are normal.     Palpations: Abdomen is soft.  Genitourinary:    Comments: Deferred per patient Musculoskeletal:        General: Normal range of motion.     Cervical back: Normal range of motion.  Lymphadenopathy:     Upper Body:     Right upper body: No supraclavicular, axillary or pectoral adenopathy.     Left upper body: No supraclavicular, axillary or pectoral adenopathy.  Skin:    General: Skin is warm.     Capillary Refill: Capillary refill takes less than 2 seconds.  Neurological:     General: No focal deficit present.     Mental Status: She is alert and oriented to person, place, and time.  Psychiatric:        Mood and Affect: Mood normal.        Behavior: Behavior normal.        Thought Content: Thought content normal.        Judgment: Judgment normal.         Assessment & Plan:   1. Encounter to establish care (Primary) - Routine labs will be checked - CBC w/Diff; Future - Comp Met (CMET); Future -  Lipid panel; Future - HgB A1c; Future - HgB A1c - Lipid panel - Comp Met (CMET) - CBC w/Diff - Lipid Panel With LDL/HDL Ratio; Future - Lipid Panel With LDL/HDL Ratio  2. Impacted cerumen of right ear - She was started on Debrox and advised on how to use. She was advised to notify clinic with worsening  symptoms. - carbamide peroxide (DEBROX) 6.5 % OTIC solution; Place 5 drops into the right ear 2 (two) times daily.  Dispense: 15 mL; Refill: 0  3. Breast pain - Bilateral Nipple inversion noted, was chronic, tenderness with palpation to right and left upper quadrant between 11-1 o'clock. She was advised to go to the ED for worsening symptoms. Also to complete [facility] financial application, reached out to [facility], stated nurse will reach out to patient to schedule an appointment. - Ambulatory referral to Surgical Oncology   Return in about 1 month (around 12/03/2023), or if symptoms worsen or fail to improve.   Jaree Dwight Trellis Paganini, NP

## 2023-11-07 ENCOUNTER — Other Ambulatory Visit: Payer: Self-pay

## 2023-11-07 LAB — CBC WITH DIFFERENTIAL/PLATELET
Basophils Absolute: 0.1 10*3/uL (ref 0.0–0.2)
Basos: 1 %
EOS (ABSOLUTE): 0.1 10*3/uL (ref 0.0–0.4)
Eos: 2 %
Hematocrit: 43.6 % (ref 34.0–46.6)
Hemoglobin: 14.4 g/dL (ref 11.1–15.9)
Immature Grans (Abs): 0 10*3/uL (ref 0.0–0.1)
Immature Granulocytes: 0 %
Lymphocytes Absolute: 1.4 10*3/uL (ref 0.7–3.1)
Lymphs: 21 %
MCH: 30.2 pg (ref 26.6–33.0)
MCHC: 33 g/dL (ref 31.5–35.7)
MCV: 91 fL (ref 79–97)
Monocytes Absolute: 0.5 10*3/uL (ref 0.1–0.9)
Monocytes: 8 %
Neutrophils Absolute: 4.6 10*3/uL (ref 1.4–7.0)
Neutrophils: 68 %
Platelets: 226 10*3/uL (ref 150–450)
RBC: 4.77 x10E6/uL (ref 3.77–5.28)
RDW: 12.6 % (ref 11.7–15.4)
WBC: 6.7 10*3/uL (ref 3.4–10.8)

## 2023-11-07 LAB — COMPREHENSIVE METABOLIC PANEL
ALT: 19 [IU]/L (ref 0–32)
AST: 23 [IU]/L (ref 0–40)
Albumin: 4.4 g/dL (ref 3.8–4.9)
Alkaline Phosphatase: 125 [IU]/L — ABNORMAL HIGH (ref 44–121)
BUN/Creatinine Ratio: 15 (ref 9–23)
BUN: 10 mg/dL (ref 6–24)
Bilirubin Total: 0.3 mg/dL (ref 0.0–1.2)
CO2: 24 mmol/L (ref 20–29)
Calcium: 9.8 mg/dL (ref 8.7–10.2)
Chloride: 106 mmol/L (ref 96–106)
Creatinine, Ser: 0.66 mg/dL (ref 0.57–1.00)
Globulin, Total: 3 g/dL (ref 1.5–4.5)
Glucose: 104 mg/dL — ABNORMAL HIGH (ref 70–99)
Potassium: 5 mmol/L (ref 3.5–5.2)
Sodium: 146 mmol/L — ABNORMAL HIGH (ref 134–144)
Total Protein: 7.4 g/dL (ref 6.0–8.5)
eGFR: 104 mL/min/{1.73_m2} (ref >59)

## 2023-11-07 LAB — HEMOGLOBIN A1C
Est. average glucose Bld gHb Est-mCnc: 100 mg/dL
Hgb A1c MFr Bld: 5.1 % (ref 4.8–5.6)

## 2023-11-08 LAB — LIPID PANEL WITH LDL/HDL RATIO

## 2023-12-03 ENCOUNTER — Ambulatory Visit: Payer: Self-pay | Admitting: Gerontology
